# Patient Record
Sex: Female | Born: 1990 | Marital: Single | Smoking: Never smoker
Health system: Southern US, Community
[De-identification: ages and names within clinical notes are randomized; demographics above are authoritative.]

## PROBLEM LIST (undated history)

## (undated) DIAGNOSIS — L732 Hidradenitis suppurativa: Secondary | ICD-10-CM

## (undated) HISTORY — PX: OTHER SURGICAL HISTORY: SHX169

## (undated) HISTORY — PX: AXILLARY SURGERY: SHX892

---

## 2014-09-07 ENCOUNTER — Other Ambulatory Visit (HOSPITAL_COMMUNITY)
Admission: RE | Admit: 2014-09-07 | Discharge: 2014-09-07 | Disposition: A | Discharge status: Home / Self Care | Payer: BC Managed Care – PPO | Source: Ambulatory Visit | Attending: Nurse Practitioner | Admitting: Nurse Practitioner

## 2014-09-07 DIAGNOSIS — Z01419 Encounter for gynecological examination (general) (routine) without abnormal findings: Secondary | ICD-10-CM | POA: Diagnosis not present

## 2014-09-07 DIAGNOSIS — Z1151 Encounter for screening for human papillomavirus (HPV): Secondary | ICD-10-CM | POA: Insufficient documentation

## 2014-12-15 ENCOUNTER — Encounter (HOSPITAL_COMMUNITY): Payer: Self-pay | Admitting: Emergency Medicine

## 2014-12-15 ENCOUNTER — Emergency Department (HOSPITAL_COMMUNITY): Payer: BLUE CROSS/BLUE SHIELD

## 2014-12-15 ENCOUNTER — Emergency Department (HOSPITAL_COMMUNITY)
Admission: EM | Admit: 2014-12-15 | Discharge: 2014-12-15 | Disposition: A | Discharge status: Home / Self Care | Payer: BLUE CROSS/BLUE SHIELD | Attending: Emergency Medicine | Admitting: Emergency Medicine

## 2014-12-15 DIAGNOSIS — Y998 Other external cause status: Secondary | ICD-10-CM | POA: Diagnosis not present

## 2014-12-15 DIAGNOSIS — Y9289 Other specified places as the place of occurrence of the external cause: Secondary | ICD-10-CM | POA: Insufficient documentation

## 2014-12-15 DIAGNOSIS — Z872 Personal history of diseases of the skin and subcutaneous tissue: Secondary | ICD-10-CM | POA: Diagnosis not present

## 2014-12-15 DIAGNOSIS — S6991XA Unspecified injury of right wrist, hand and finger(s), initial encounter: Secondary | ICD-10-CM | POA: Diagnosis present

## 2014-12-15 DIAGNOSIS — Y9389 Activity, other specified: Secondary | ICD-10-CM | POA: Insufficient documentation

## 2014-12-15 DIAGNOSIS — S6010XA Contusion of unspecified finger with damage to nail, initial encounter: Secondary | ICD-10-CM

## 2014-12-15 DIAGNOSIS — S60121A Contusion of right index finger with damage to nail, initial encounter: Secondary | ICD-10-CM | POA: Diagnosis not present

## 2014-12-15 DIAGNOSIS — M79644 Pain in right finger(s): Secondary | ICD-10-CM

## 2014-12-15 DIAGNOSIS — W231XXA Caught, crushed, jammed, or pinched between stationary objects, initial encounter: Secondary | ICD-10-CM | POA: Diagnosis not present

## 2014-12-15 HISTORY — DX: Hidradenitis suppurativa: L73.2

## 2014-12-15 MED ORDER — NAPROXEN 500 MG PO TABS
500.0000 mg | ORAL_TABLET | Freq: Once | ORAL | Status: AC
  Administered 2014-12-15: 500 mg via ORAL
  Filled 2014-12-15: qty 1

## 2014-12-15 MED ORDER — NAPROXEN 500 MG PO TABS: 500.0000 mg | ORAL_TABLET | Freq: Two times a day (BID) | ORAL | Status: AC

## 2014-12-15 NOTE — ED Provider Notes (Signed)
CSN: [id number]     Arrival date & time 12/15/14  1858 History  This chart was scribed for non-physician practitioner, Antony MaduraKelly Tishawn Friedhoff, PA working with Suzi RootsKevin E Steinl, MD by Gwenyth Oberatherine Macek, ED scribe. This patient was seen in room WTR6/WTR6 and the patient's care was started at 8:27 PM  Chief Complaint  Patient presents with  . Finger Injury   The history is provided by the patient. No language interpreter was used.   HPI Comments: Consepcion HearingJasmine Reed is a 24 y.o. female with a history of hydradenitis who presents to the Emergency Department complaining of constant, waxing and waning, sharp, aching and throbbing right index finger pain that started 1 day ago after she closed her finger in her car door. Pt notes redness and swelling as associated symptoms. She has not tried any treatment PTA. Pt wears acrylic nails. She denies numbness as an associated symptom.  Past Medical History  Diagnosis Date  . Hydradenitis    Past Surgical History  Procedure Laterality Date  . Extraction of wisdom teeth    . Axillary surgery     Family History  Problem Relation Age of Onset  . Hypertension Other    History  Substance Use Topics  . Smoking status: Never Smoker   . Smokeless tobacco: Not on file  . Alcohol Use: Yes     Comment: occ   OB History    No data available      Review of Systems  Musculoskeletal: Positive for joint swelling and arthralgias.  Neurological: Negative for numbness.  All other systems reviewed and are negative.   Allergies  Review of patient's allergies indicates no known allergies.  Home Medications   Prior to Admission medications   Medication Sig Start Date End Date Taking? Authorizing Provider  naproxen (NAPROSYN) 500 MG tablet Take 1 tablet (500 mg total) by mouth 2 (two) times daily. 12/15/14   Antony MaduraKelly Christine Schiefelbein, PA-C   BP 127/82 mmHg  Pulse 98  Temp(Src) 98.7 F (37.1 C) (Oral)  Resp 16  SpO2 99%  LMP 11/14/2014 (Approximate)   Physical Exam   Constitutional: She is oriented to person, place, and time. She appears well-developed and well-nourished. No distress.  Nontoxic/nonseptic appearing  HENT:  Head: Normocephalic and atraumatic.  Eyes: Conjunctivae and EOM are normal. No scleral icterus.  Neck: Normal range of motion.  Cardiovascular: Normal rate, regular rhythm and intact distal pulses.   Distal radial pulse 2+ in RUE. Capillary refill brisk in all digits of R hand.  Pulmonary/Chest: Effort normal. No respiratory distress.  Respirations even and unlabored  Musculoskeletal: Normal range of motion.       Right hand: She exhibits tenderness and swelling (Very mild distal to the DIP joint of the right second digit). She exhibits normal range of motion, no bony tenderness, normal two-point discrimination, normal capillary refill and no deformity. Normal sensation noted. Normal strength noted.       Hands: 5/5 strength against resistance of FDP, FDS, and extensors of right 2nd digit. Subungual hematoma noted to the base of the right second fingernail; extent unknown as view limited secondary to painted acrylic nails. TTP without bony deformity or crepitus. Mild associated swelling WITHOUT erythema or heat to touch.  Neurological: She is alert and oriented to person, place, and time.  Skin: Skin is warm and dry. No rash noted. She is not diaphoretic. No erythema. No pallor.  Psychiatric: She has a normal mood and affect. Her behavior is normal.  Nursing note and vitals  reviewed.   ED Course  Procedures (including critical care time) DIAGNOSTIC STUDIES: Oxygen Saturation is 99% on RA, normal by my interpretation.    COORDINATION OF CARE: 8:32 PM Discussed treatment plan with pt at bedside and pt agreed to plan.  Labs Review Labs Reviewed - No data to display  Imaging Review Dg Finger Index Right  12/15/2014   CLINICAL DATA:  Initial evaluation for acute traumatic injury.  EXAM: RIGHT INDEX FINGER 2+V  COMPARISON:  None.   FINDINGS: There is no evidence of fracture or dislocation. There is no evidence of arthropathy or other focal bone abnormality. Soft tissues are unremarkable.  IMPRESSION: Negative.   Electronically Signed   By: Rise Mu M.D.   On: 12/15/2014 20:30     EKG Interpretation None      MDM   Final diagnoses:  Subungual hematoma of digit of hand, initial encounter  Pain in finger of right hand    24 year old female presents to the emergency department for further evaluation of pain to her right index finger. Patient neurovascularly intact. Subungual hematoma noted, but extent of this is unknown as patient has painted acrylic nails over top. X-ray negative for fracture. Patient declines cautery. Will manage supportively with RICE, finger splint, and Naproxen. Return precautions given. Patient agreeable to plan with no unaddressed concerns. Patient discharged in good condition.  I personally performed the services described in this documentation, which was scribed in my presence. The recorded information has been reviewed and is accurate.   Filed Vitals:   12/15/14 1922  BP: 127/82  Pulse: 98  Temp: 98.7 F (37.1 C)  TempSrc: Oral  Resp: 16  SpO2: 99%     Antony Madura, PA-C 12/15/14 2108  Suzi Roots, MD 12/18/14 [phone]

## 2014-12-15 NOTE — ED Notes (Signed)
Pt states she shut her right index finger in the car door yesterday  Pt has redness and swelling noted

## 2014-12-15 NOTE — Discharge Instructions (Signed)
Subungual Hematoma A subungual hematoma is a pocket of blood that collects under the fingernail or toenail. The pressure created by the blood under the nail can cause pain. CAUSES  A subungual hematoma occurs when an injury to the finger or toe causes a blood vessel beneath the nail to break. The injury can occur from a direct blow such as slamming a finger in a door. It can also occur from a repeated injury such as pressure on the foot in a shoe while running. A subungual hematoma is sometimes called runner's toe or tennis toe. SYMPTOMS   Blue or dark blue skin under the nail.  Pain or throbbing in the injured area. DIAGNOSIS  Your caregiver can determine whether you have a subungual hematoma based on your history and a physical exam. If your caregiver thinks you might have a broken (fractured) bone, X-rays may be taken. TREATMENT  Hematomas usually go away on their own over time. Your caregiver may make a hole in the nail to drain the blood. Draining the blood is painless and usually provides significant relief from pain and throbbing. The nail usually grows back normally after this procedure. In some cases, the nail may need to be removed. This is done if there is a cut under the nail that requires stitches (sutures). HOME CARE INSTRUCTIONS   Put ice on the injured area.  Put ice in a plastic bag.  Place a towel between your skin and the bag.  Leave the ice on for 15-20 minutes, 03-04 times a day for the first 1 to 2 days.  Elevate the injured area to help decrease pain and swelling.  If you were given a bandage, wear it for as long as directed by your caregiver.  If part of your nail falls off, trim the remaining nail gently. This prevents the nail from catching on something and causing further injury.  Only take over-the-counter or prescription medicines for pain, discomfort, or fever as directed by your caregiver. SEEK IMMEDIATE MEDICAL CARE IF:   You have redness or swelling  around the nail.  You have yellowish-white fluid (pus) coming from the nail.  Your pain is not controlled with medicine.  You have a fever. MAKE SURE YOU:  Understand these instructions.  Will watch your condition.  Will get help right away if you are not doing well or get worse. Document Released: 10/12/2000 Document Revised: 01/07/2012 Document Reviewed: 10/03/2011 [facility] Patient Information 2015 [place], [place]. This information is not intended to replace advice given to you by your health care provider. Make sure you discuss any questions you have with your health care provider. RICE: Routine Care for Injuries Rest, Ice, Compression, and Elevation (RICE) are often used to care for injuries. HOME CARE  Rest your injury.  Put ice on the injury.  Put ice in a plastic bag.  Place a towel between your skin and the bag.  Leave the ice on for 15-20 minutes, 03-04 times a day. Do this for as long as told by your doctor.  Apply pressure (compression) with an elastic bandage. Remove and reapply the bandage every 3 to 4 hours. Do not wrap the bandage too tight. Wrap the bandage looser if the fingers or toes are puffy (swollen), blue, cold, painful, or lose feeling (numb).  Raise (elevate) your injury. Raise your injury above the heart if you can. GET HELP RIGHT AWAY IF:  You have lasting pain or puffiness.  Your injury is red, weak, or loses feeling.  Your problems get worse, not better, after several days. MAKE SURE YOU:  Understand these instructions.  Will watch your condition.  Will get help right away if you are not doing well or get worse. Document Released: 04/02/2008 Document Revised: 01/07/2012 Document Reviewed: 03/16/2011 [facility] Patient Information 2015 [place], [place]. This information is not intended to replace advice given to you by your health care provider. Make sure you discuss any questions you have with your health care provider.

## 2016-07-25 IMAGING — CR DG FINGER INDEX 2+V*R*
3 series · 3 of 3 positions shown · non-contrast
Comparison: None.

CLINICAL DATA: Initial evaluation for acute traumatic injury.

EXAM:
RIGHT INDEX FINGER 2+V

[x finger pa right]
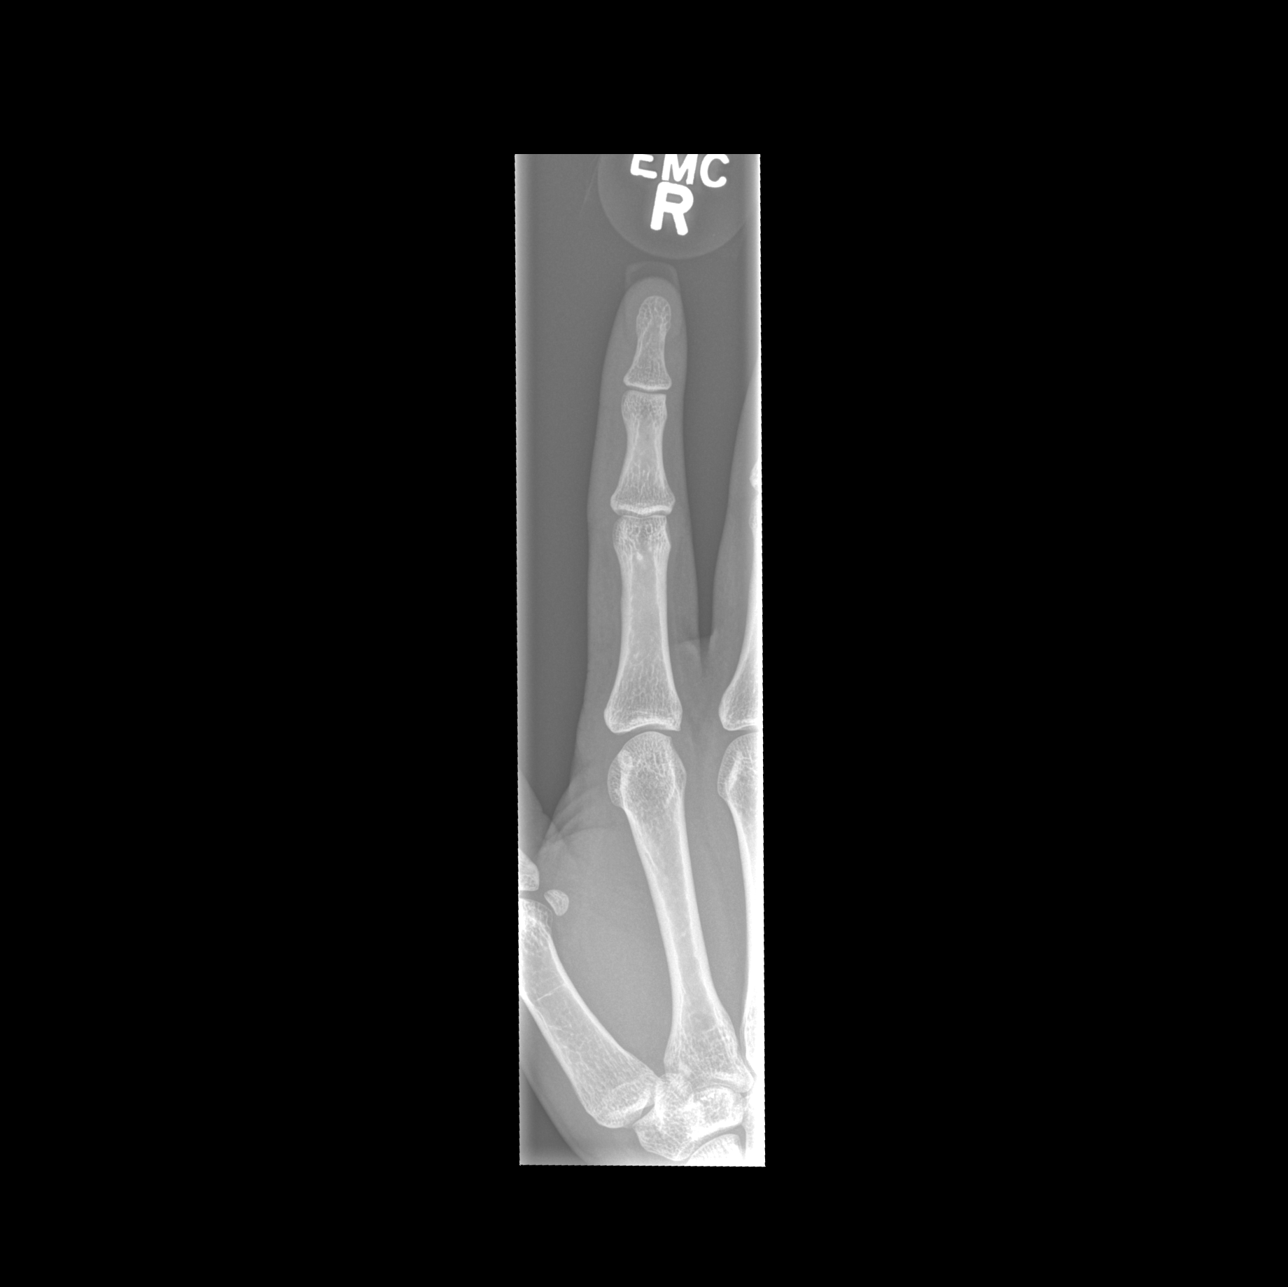

[x finger obl right]
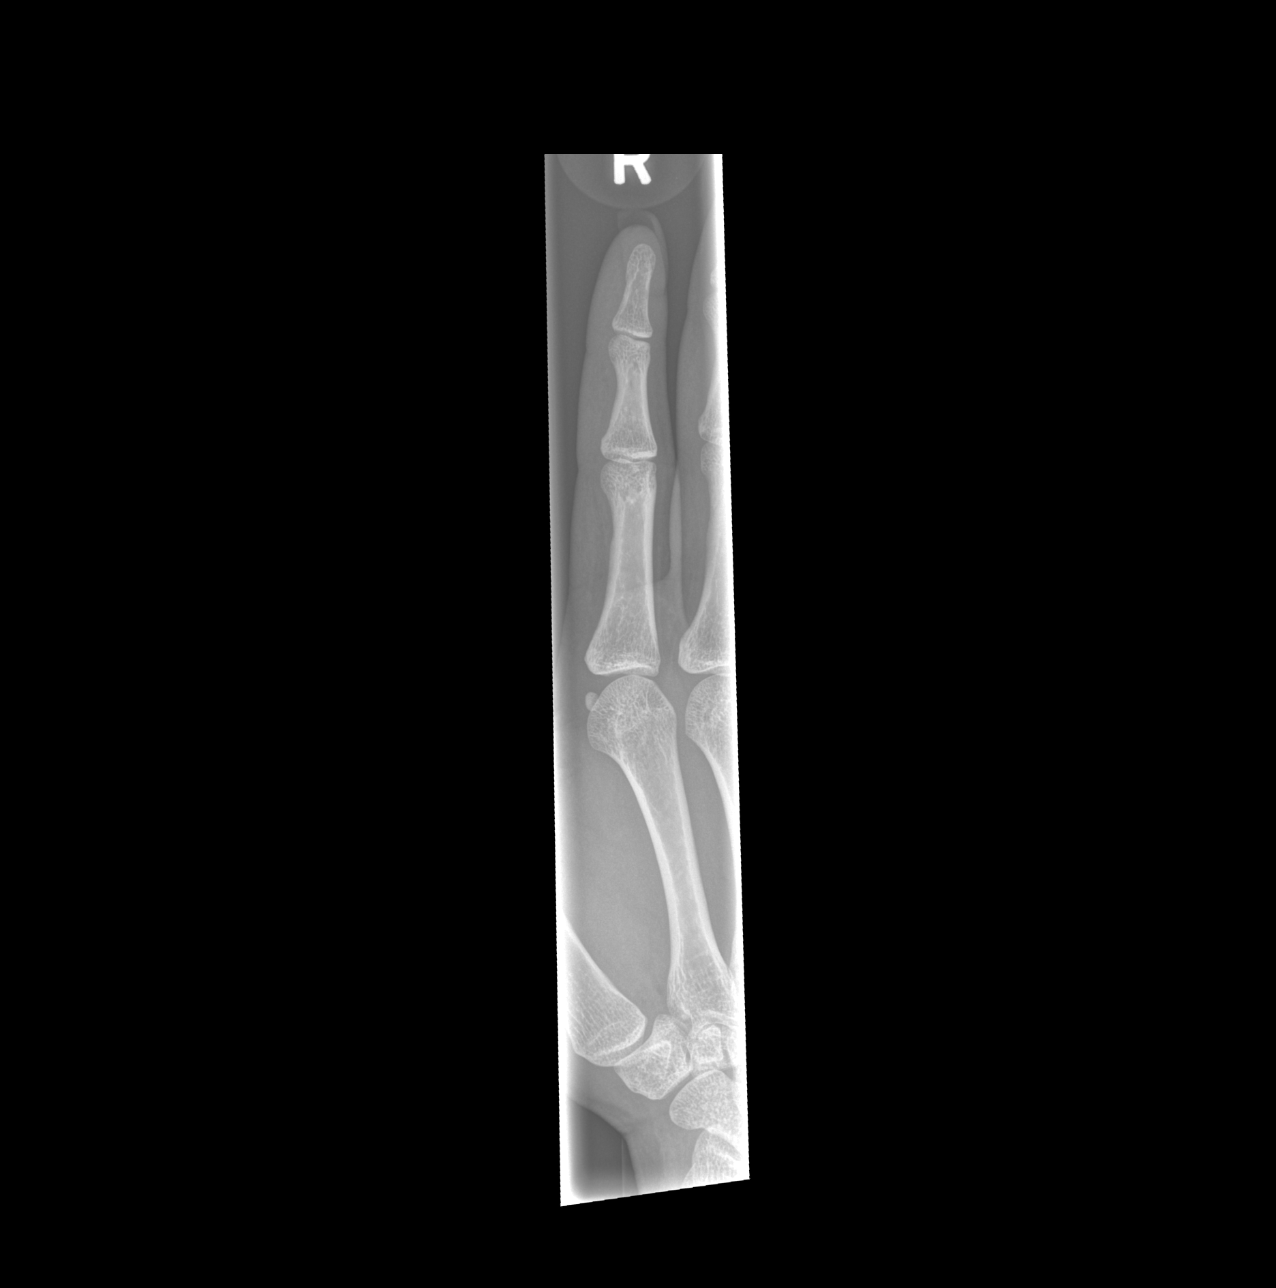

[x finger lat right]
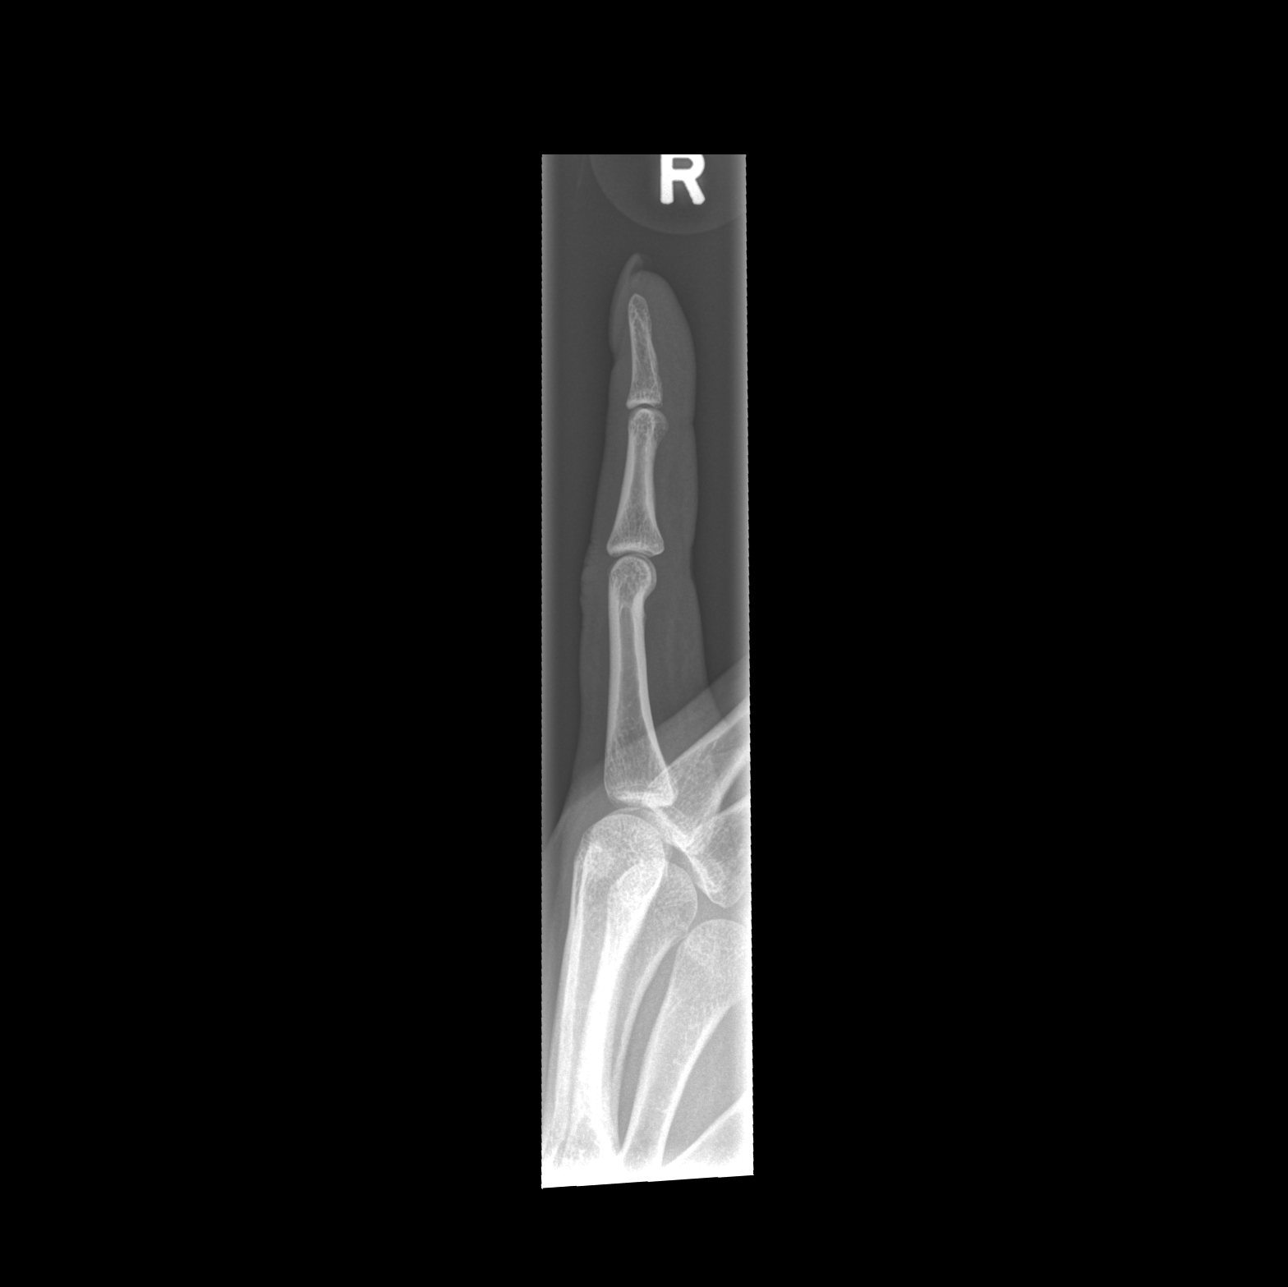

[3 of 3 positions shown; findings below may reference images not displayed]

FINDINGS: There is no evidence of fracture or dislocation. There is no
evidence of arthropathy or other focal bone abnormality. Soft
tissues are unremarkable.
IMPRESSION: Negative.
